# Patient Record
Sex: Female | Born: 1965 | Race: White | Hispanic: No | Marital: Single | Smoking: Never smoker
Health system: Southern US, Community
[De-identification: ages and names within clinical notes are randomized; demographics above are authoritative.]

---

## 2010-08-14 ENCOUNTER — Emergency Department (HOSPITAL_COMMUNITY): Admission: EM | Admit: 2010-08-14 | Discharge: 2010-08-14 | Payer: Self-pay | Admitting: Emergency Medicine

## 2021-03-16 ENCOUNTER — Emergency Department (HOSPITAL_BASED_OUTPATIENT_CLINIC_OR_DEPARTMENT_OTHER)
Admission: EM | Admit: 2021-03-16 | Discharge: 2021-03-16 | Disposition: A | Discharge status: Home / Self Care | Payer: Self-pay | Attending: Emergency Medicine | Admitting: Emergency Medicine

## 2021-03-16 ENCOUNTER — Encounter (HOSPITAL_BASED_OUTPATIENT_CLINIC_OR_DEPARTMENT_OTHER): Payer: Self-pay

## 2021-03-16 ENCOUNTER — Other Ambulatory Visit: Payer: Self-pay

## 2021-03-16 ENCOUNTER — Emergency Department (HOSPITAL_BASED_OUTPATIENT_CLINIC_OR_DEPARTMENT_OTHER): Payer: Self-pay

## 2021-03-16 DIAGNOSIS — R300 Dysuria: Secondary | ICD-10-CM | POA: Insufficient documentation

## 2021-03-16 DIAGNOSIS — Z87891 Personal history of nicotine dependence: Secondary | ICD-10-CM | POA: Insufficient documentation

## 2021-03-16 DIAGNOSIS — Z20822 Contact with and (suspected) exposure to covid-19: Secondary | ICD-10-CM | POA: Insufficient documentation

## 2021-03-16 DIAGNOSIS — R109 Unspecified abdominal pain: Secondary | ICD-10-CM | POA: Insufficient documentation

## 2021-03-16 LAB — CBC WITH DIFFERENTIAL/PLATELET
Abs Immature Granulocytes: 0.02 10*3/uL (ref 0.00–0.07)
Basophils Absolute: 0 10*3/uL (ref 0.0–0.1)
Basophils Relative: 0 %
Eosinophils Absolute: 0.1 10*3/uL (ref 0.0–0.5)
Eosinophils Relative: 1 %
HCT: 38.8 % (ref 36.0–46.0)
Hemoglobin: 12.7 g/dL (ref 12.0–15.0)
Immature Granulocytes: 0 %
Lymphocytes Relative: 19 %
Lymphs Abs: 1.4 10*3/uL (ref 0.7–4.0)
MCH: 29.9 pg (ref 26.0–34.0)
MCHC: 32.7 g/dL (ref 30.0–36.0)
MCV: 91.3 fL (ref 80.0–100.0)
Monocytes Absolute: 0.8 10*3/uL (ref 0.1–1.0)
Monocytes Relative: 10 %
Neutro Abs: 5.4 10*3/uL (ref 1.7–7.7)
Neutrophils Relative %: 70 %
Platelets: 262 10*3/uL (ref 150–400)
RBC: 4.25 MIL/uL (ref 3.87–5.11)
RDW: 13.8 % (ref 11.5–15.5)
WBC: 7.7 10*3/uL (ref 4.0–10.5)
nRBC: 0 % (ref 0.0–0.2)

## 2021-03-16 LAB — URINALYSIS, ROUTINE W REFLEX MICROSCOPIC
Bilirubin Urine: NEGATIVE
Glucose, UA: NEGATIVE mg/dL
Ketones, ur: 15 mg/dL — AB
Nitrite: NEGATIVE
Protein, ur: NEGATIVE mg/dL
Specific Gravity, Urine: 1.01 (ref 1.005–1.030)
pH: 5.5 (ref 5.0–8.0)

## 2021-03-16 LAB — COMPREHENSIVE METABOLIC PANEL
ALT: 30 U/L (ref 0–44)
AST: 20 U/L (ref 15–41)
Albumin: 3.7 g/dL (ref 3.5–5.0)
Alkaline Phosphatase: 66 U/L (ref 38–126)
Anion gap: 8 (ref 5–15)
BUN: 19 mg/dL (ref 6–20)
CO2: 29 mmol/L (ref 22–32)
Calcium: 9.2 mg/dL (ref 8.9–10.3)
Chloride: 102 mmol/L (ref 98–111)
Creatinine, Ser: 0.8 mg/dL (ref 0.44–1.00)
GFR, Estimated: >60 mL/min (ref >60)
Glucose, Bld: 105 mg/dL — ABNORMAL HIGH (ref 70–99)
Potassium: 3.8 mmol/L (ref 3.5–5.1)
Sodium: 139 mmol/L (ref 135–145)
Total Bilirubin: 0.3 mg/dL (ref 0.3–1.2)
Total Protein: 7.1 g/dL (ref 6.5–8.1)

## 2021-03-16 LAB — URINALYSIS, MICROSCOPIC (REFLEX)

## 2021-03-16 LAB — RESP PANEL BY RT-PCR (FLU A&B, COVID) ARPGX2
Influenza A by PCR: NEGATIVE
Influenza B by PCR: NEGATIVE
SARS Coronavirus 2 by RT PCR: NEGATIVE

## 2021-03-16 LAB — LIPASE, BLOOD: Lipase: 27 U/L (ref 11–51)

## 2021-03-16 MED ORDER — CEPHALEXIN 500 MG PO CAPS: 1000.0000 mg | ORAL_CAPSULE | Freq: Two times a day (BID) | ORAL | 0 refills | Status: AC

## 2021-03-16 MED ORDER — KETOROLAC TROMETHAMINE 60 MG/2ML IM SOLN
60.0000 mg | Freq: Once | INTRAMUSCULAR | Status: AC
  Administered 2021-03-16: 60 mg via INTRAMUSCULAR
  Filled 2021-03-16: qty 2

## 2021-03-16 NOTE — ED Triage Notes (Signed)
R flank pain x 1 week that is increasingly worse today.  Reports some dysuria but has not noticed any hematuria.

## 2021-03-16 NOTE — Discharge Instructions (Addendum)
You were evaluated in the Emergency Department and after careful evaluation, we did not find any emergent condition requiring admission or further testing in the hospital.  Please take the antibiotic as directed until finished.  You may follow-up with [facility] which is a free clinic here in the area, I provided the contact information.  Please return to the ER for any new or worsening symptoms.  Please return to the Emergency Department if you experience any worsening of your condition.  We encourage you to follow up with a primary care provider.  Thank you for allowing [place] to be a part of your care.

## 2021-03-16 NOTE — ED Provider Notes (Signed)
[facility] EMERGENCY DEPARTMENT Provider Note   CSN: [id number] Arrival date & time: 03/16/21  1516     History Chief Complaint  Patient presents with  . Flank Pain    Alyssa Wilkinson is a 55 y.o. female.  HPI 55 year old female with no significant medical history presents to the ER with complaints of 1 week of right flank pain.  Patient states that she went on vacation last week and was drinking a lot of soda.  She reports gradual onset of right-sided flank pain.  She attributed this to soda use, however stopped drinking soda all of this week but the pain has continued to get worse.  Pain is sharp and stabbing.  Denies any nausea, vomiting.  She endorses some dysuria but no hematuria.  No prior history of kidney stones, however the patient states that she had a similar presentation where her scan was inconclusive, possibly showed a recently passed stone.  She denies any abdominal pain, diarrhea, pelvic pain or vaginal bleeding.    History reviewed. No pertinent past medical history.  There are no problems to display for this patient.   History reviewed. No pertinent surgical history.   OB History   No obstetric history on file.     History reviewed. No pertinent family history.  Social History   Tobacco Use  . Smoking status: Never Smoker  . Smokeless tobacco: Never Used  Vaping Use  . Vaping Use: Never used  Substance Use Topics  . Alcohol use: Never  . Drug use: Never    Home Medications Prior to Admission medications   Medication Sig Start Date End Date Taking? Authorizing Provider  cephALEXin (KEFLEX) 500 MG capsule Take 2 capsules (1,000 mg total) by mouth 2 (two) times daily for 7 days. 03/16/21 03/23/21 Yes Mare Ferrari, PA-C  Multiple Vitamin (MULTIVITAMIN) tablet Take 1 tablet by mouth daily.   Yes [provider]    Allergies    Patient has no allergy information on record.  Review of Systems   Review of Systems  Constitutional:  Negative for chills and fever.  HENT: Negative for ear pain and sore throat.   Eyes: Negative for pain and visual disturbance.  Respiratory: Negative for cough and shortness of breath.   Cardiovascular: Negative for chest pain and palpitations.  Gastrointestinal: Negative for abdominal pain and vomiting.  Genitourinary: Positive for dysuria and flank pain. Negative for hematuria, vaginal bleeding, vaginal discharge and vaginal pain.  Musculoskeletal: Negative for arthralgias and back pain.  Skin: Negative for color change and rash.  Neurological: Negative for seizures and syncope.  All other systems reviewed and are negative.   Physical Exam Updated Vital Signs BP (!) 132/91 (BP Location: Right Arm)   Pulse 88   Temp 98.4 F (36.9 C) (Oral)   Resp 18   Ht 5\' 2"  (1.575 m)   Wt 74.8 kg   SpO2 99%   BMI 30.18 kg/m   Physical Exam Vitals and nursing note reviewed.  Constitutional:      General: She is not in acute distress.    Appearance: She is well-developed. She is not ill-appearing or diaphoretic.  HENT:     Head: Normocephalic and atraumatic.  Eyes:     Conjunctiva/sclera: Conjunctivae normal.  Cardiovascular:     Rate and Rhythm: Normal rate and regular rhythm.     Heart sounds: No murmur heard.   Pulmonary:     Effort: Pulmonary effort is normal. No respiratory distress.  Breath sounds: Normal breath sounds.  Abdominal:     Palpations: Abdomen is soft.     Tenderness: There is no abdominal tenderness. There is right CVA tenderness.  Musculoskeletal:        General: Normal range of motion.     Cervical back: Normal range of motion and neck supple.  Skin:    General: Skin is warm and dry.  Neurological:     General: No focal deficit present.     Mental Status: She is alert and oriented to person, place, and time.  Psychiatric:        Mood and Affect: Mood normal.        Behavior: Behavior normal.     ED Results / Procedures / Treatments   Labs (all  labs ordered are listed, but only abnormal results are displayed) Labs Reviewed  URINALYSIS, ROUTINE W REFLEX MICROSCOPIC - Abnormal; Notable for the following components:      Result Value   Hgb urine dipstick TRACE (*)    Ketones, ur 15 (*)    Leukocytes,Ua SMALL (*)    All other components within normal limits  URINALYSIS, MICROSCOPIC (REFLEX) - Abnormal; Notable for the following components:   Bacteria, UA RARE (*)    All other components within normal limits  COMPREHENSIVE METABOLIC PANEL - Abnormal; Notable for the following components:   Glucose, Bld 105 (*)    All other components within normal limits  URINE CULTURE  RESP PANEL BY RT-PCR (FLU A&B, COVID) [id number]  CBC WITH DIFFERENTIAL/PLATELET  LIPASE, BLOOD    EKG None  Radiology CT Renal Stone Study  Result Date: 03/16/2021 CLINICAL DATA:  Flank pain EXAM: CT ABDOMEN AND PELVIS WITHOUT CONTRAST TECHNIQUE: Multidetector CT imaging of the abdomen and pelvis was performed following the standard protocol without IV contrast. COMPARISON:  None. FINDINGS: Lower chest: No acute abnormality. Hepatobiliary: No focal liver abnormality is seen. Status post cholecystectomy. No biliary dilatation. Pancreas: Unremarkable. No pancreatic ductal dilatation or surrounding inflammatory changes. Spleen: Normal in size without focal abnormality. Adrenals/Urinary Tract: Adrenal glands are unremarkable. Mild RIGHT-sided hydronephrosis and proximal hydroureter, and mild perinephric/periureteral inflammation. No obstructing ureteral stone. Bladder is unremarkable, decompressed. No bladder stone is seen. Stomach/Bowel: No dilated large or small bowel loops. No evidence of bowel wall inflammation. Stomach is unremarkable. Appendix is normal. Vascular/Lymphatic: Mild aortic atherosclerosis. No enlarged lymph nodes are seen within the abdomen or pelvis. Reproductive: Presumed hysterectomy. No adnexal mass or free fluid is seen. Other: No free fluid or abscess  collection. No free intraperitoneal air. Musculoskeletal: No acute appearing osseous abnormality. 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 IMPRESSION: 1. Mild RIGHT-sided hydronephrosis and proximal hydroureter, and mild perinephric/periureteral inflammation. No obstructing ureteral stone. Findings could represent a recently passed stone or an ascending ureteral infection. Recommend correlation with urinalysis. 2. No other acute or significant findings within the abdomen or pelvis. No bowel obstruction or evidence of bowel wall inflammation. Appendix is normal. Aortic Atherosclerosis (ICD10-I70.0). Electronically Signed   By: Bary Richard M.D.   On: 03/16/2021 17:43    Procedures Procedures   Medications Ordered in ED Medications  ketorolac (TORADOL) injection 60 mg (60 mg Intramuscular Given 03/16/21 1757)    ED Course  I have reviewed the triage vital signs and the nursing notes.  Pertinent labs & imaging results that were available during my care of the patient were reviewed by me and considered in my medical decision making (see chart for details).  MDM Rules/Calculators/A&P                          55 year old female presents to the ER with right flank pain.  On arrival, she is well-appearing, no acute distress, resting comfortably in the ER bed.  It is overall reassuring.  Afebrile, not tachycardic, tachypneic or hypoxic.  Sickle exam consistent with right CVA tenderness, soft and nontender abdomen.  Dx includes pyelonephritis, renal stones, musculoskeletal strain, COVID-19/viral infection  Labs and imaging ordered, reviewed and interpreted by me.  CBC without leukocytosis, CMP unremarkable.  Normal lipase.  UA with trace hemoglobin, 15 of ketones, small leukocytes and rare bacteria.  Not overly consistent with a UTI, however sent for culture.  CT renal study as below IMPRESSION:  1. Mild RIGHT-sided hydronephrosis and proximal hydroureter, and  mild  perinephric/periureteral inflammation. No obstructing ureteral  stone. Findings could represent a recently passed stone or an  ascending ureteral infection. Recommend correlation with urinalysis.  2. No other acute or significant findings within the abdomen or  pelvis. No bowel obstruction or evidence of bowel wall inflammation.  Appendix is normal.      Given patient is having some dysuria, will treat for possible pyelonephritis.  Patient's pain under control currently.  Patient is uninsured at this time, will provide her referral to [facility], strict return precautions discussed.  She was understanding and is agreeable.  Stable for discharge. Final Clinical Impression(s) / ED Diagnoses Final diagnoses:  Right flank pain    Rx / DC Orders ED Discharge Orders         Ordered    cephALEXin (KEFLEX) 500 MG capsule  2 times daily        03/16/21 1833           Leone Brand 03/16/21 Lyndal Pulley, MD 03/17/21 [phone]

## 2021-03-19 LAB — URINE CULTURE: Culture: 80000 — AB

## 2021-03-20 ENCOUNTER — Telehealth: Payer: Self-pay | Admitting: *Deleted

## 2022-02-27 IMAGING — CT CT RENAL STONE PROTOCOL
2 of 4 series · 16 of 46 positions shown, 18 images · non-contrast
Comparison: None.

CLINICAL DATA: Flank pain

EXAM:
CT ABDOMEN AND PELVIS WITHOUT CONTRAST
TECHNIQUE: Multidetector CT imaging of the abdomen and pelvis was performed
following the standard protocol without IV contrast.

[Series 2: axial st · axial · 0.77mm/px · z∈[+674,+1094]mm · 13 of 92 slices shown, 15 images]
[im 4/92  soft-tissue]
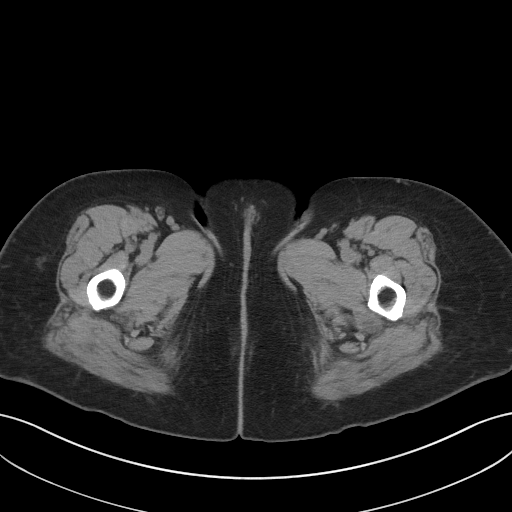
[im 4/92  bone]
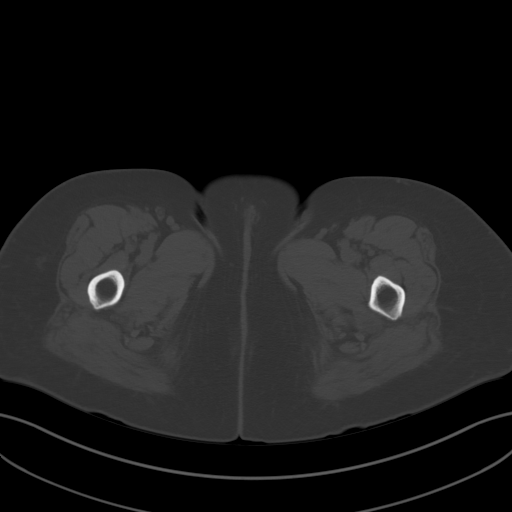
[im 12/92  soft-tissue]
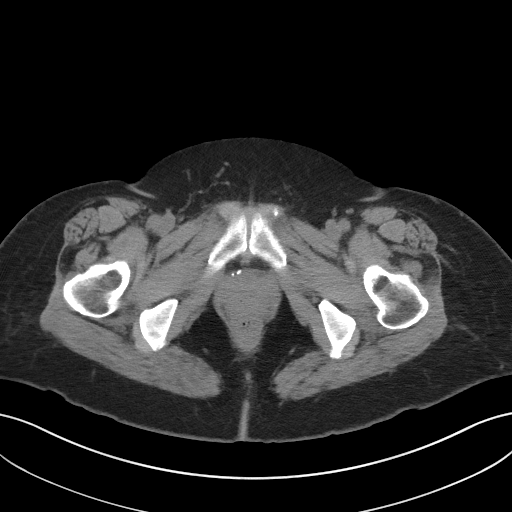
[im 19/92  soft-tissue]
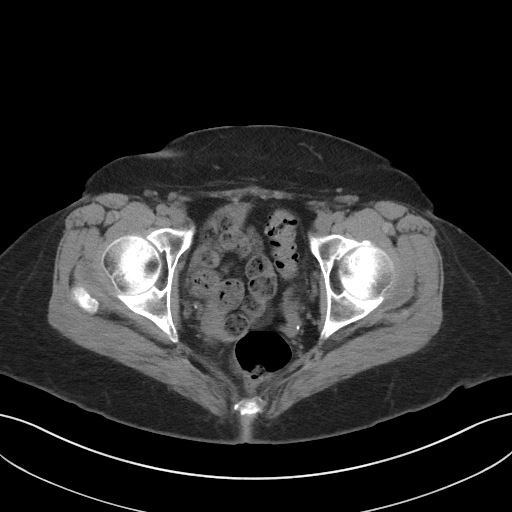
[im 27/92  soft-tissue]
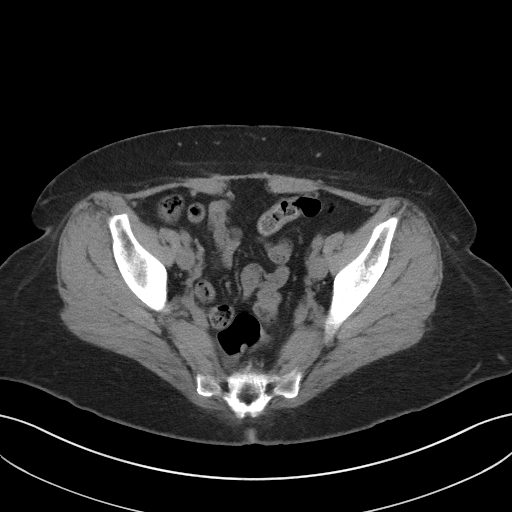
[im 31/92  soft-tissue]
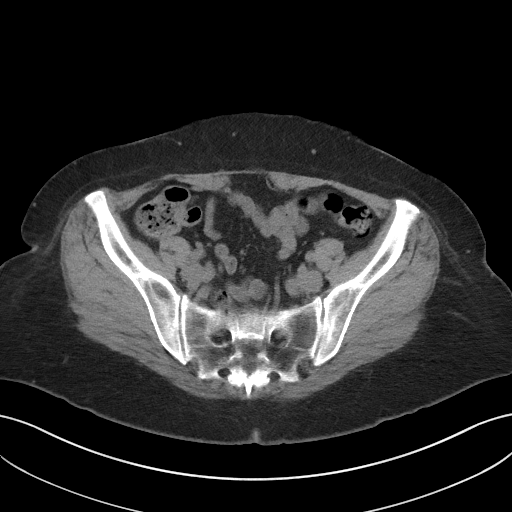
[im 38/92  soft-tissue]
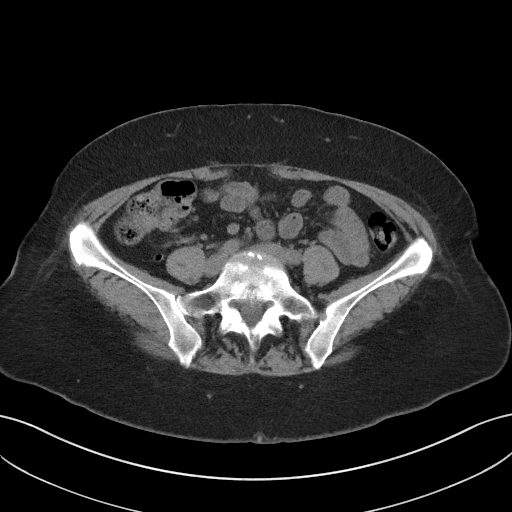
[im 46/92  soft-tissue]
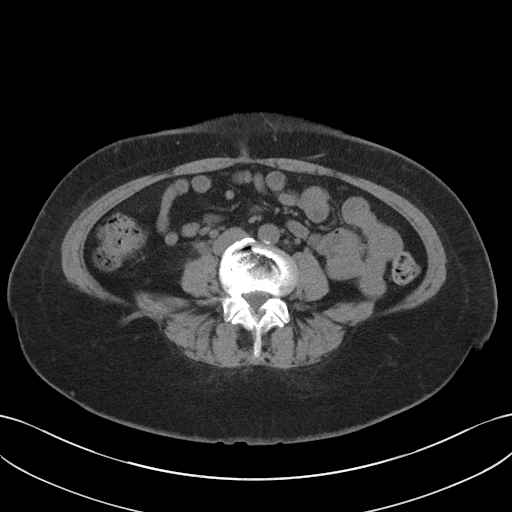
[im 54/92  soft-tissue]
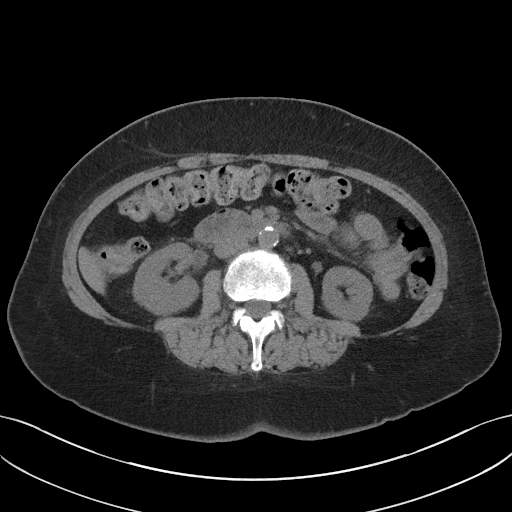
[im 61/92  soft-tissue]
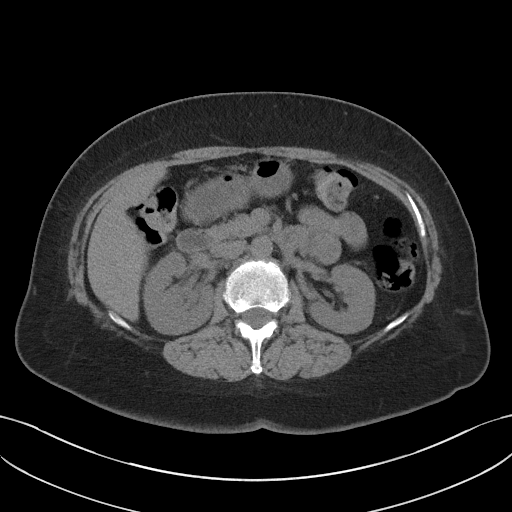
[im 61/92  bone]
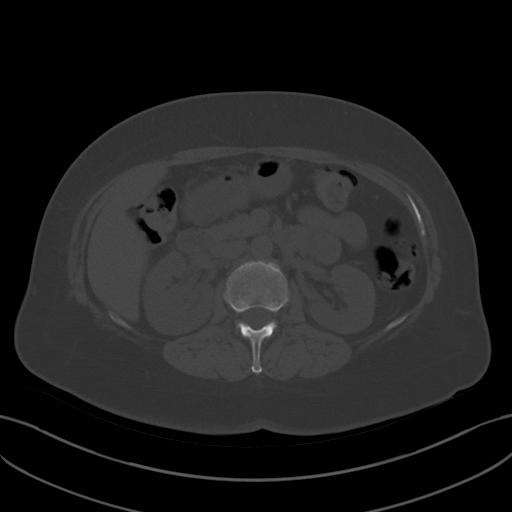
[im 65/92  soft-tissue]
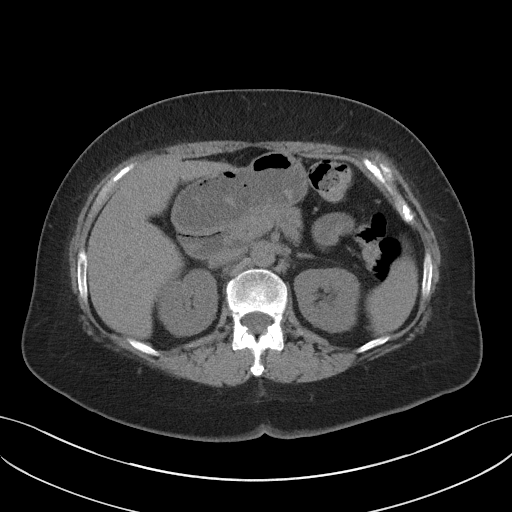
[im 73/92  soft-tissue]
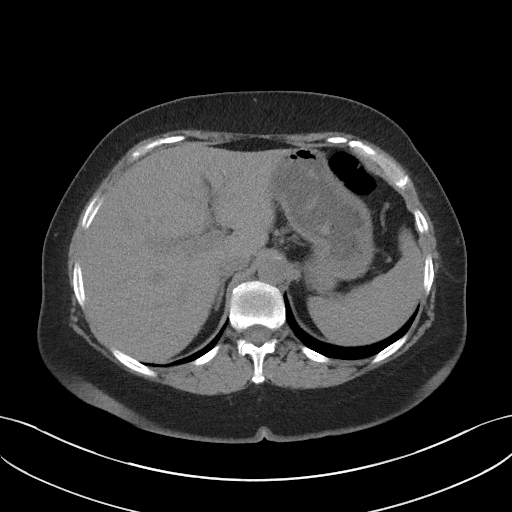
[im 80/92  soft-tissue]
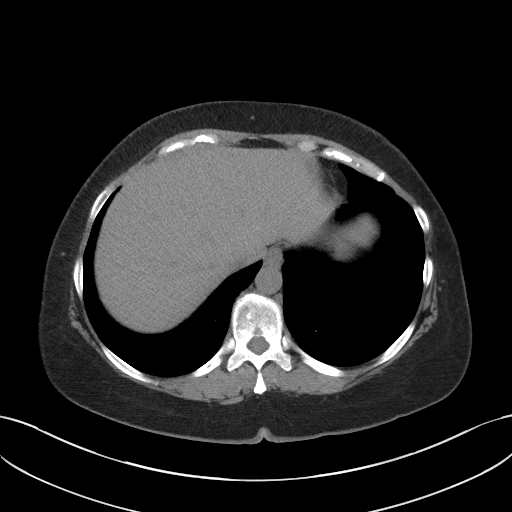
[im 88/92  soft-tissue]
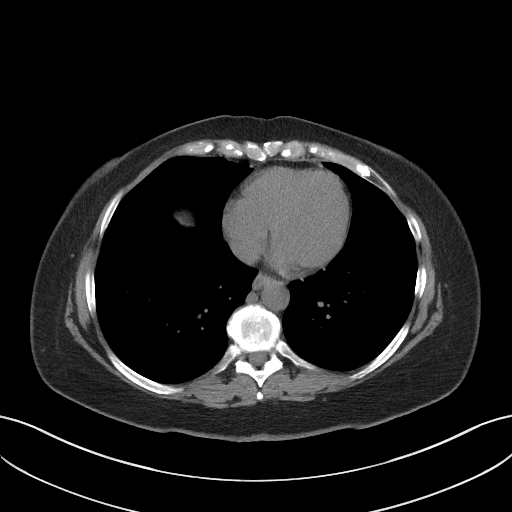

[Series 5: coronal st · coronal · 0.90mm/px · 3 of 104 slices shown]
[im 35/104  soft-tissue]
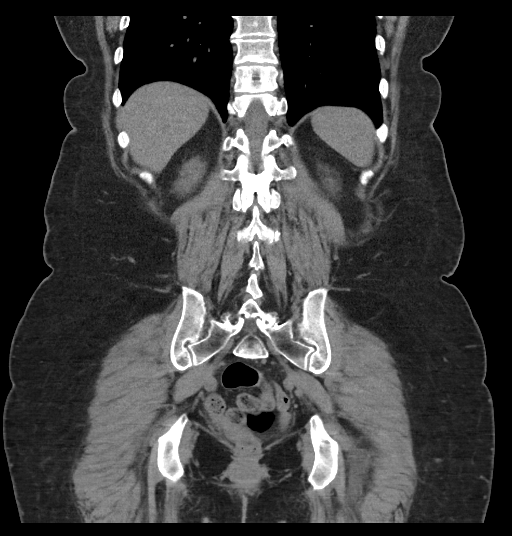
[im 46/104  soft-tissue]
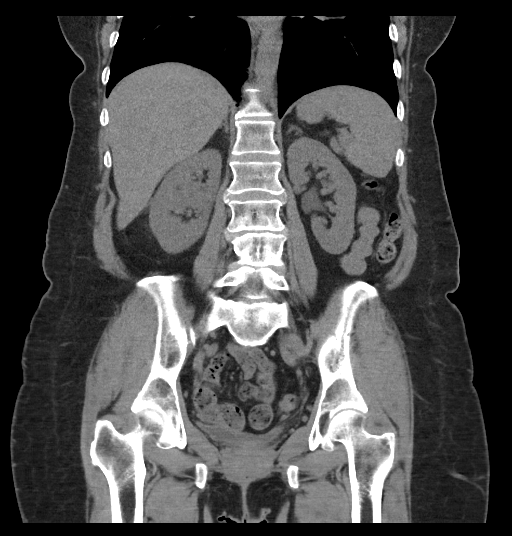
[im 58/104  soft-tissue]
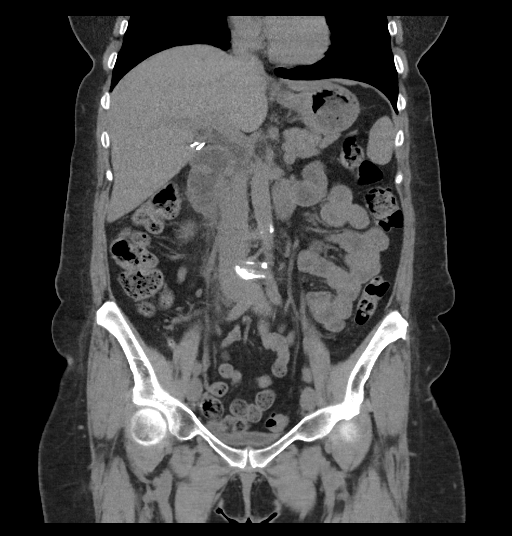

[16 of 46 positions shown; findings below may reference images not displayed]

FINDINGS: Lower chest: No acute abnormality.

Hepatobiliary: No focal liver abnormality is seen. Status post
cholecystectomy. No biliary dilatation.

Pancreas: Unremarkable. No pancreatic ductal dilatation or
surrounding inflammatory changes.

Spleen: Normal in size without focal abnormality.

Adrenals/Urinary Tract: Adrenal glands are unremarkable. Mild
RIGHT-sided hydronephrosis and proximal hydroureter, and mild
perinephric/periureteral inflammation. No obstructing ureteral
stone. Bladder is unremarkable, decompressed. No bladder stone is
seen.

Stomach/Bowel: No dilated large or small bowel loops. No evidence of
bowel wall inflammation. Stomach is unremarkable. Appendix is
normal.

Vascular/Lymphatic: Mild aortic atherosclerosis. No enlarged lymph
nodes are seen within the abdomen or pelvis.

Reproductive: Presumed hysterectomy. No adnexal mass or free fluid
is seen.

Other: No free fluid or abscess collection. No free intraperitoneal
air.

Musculoskeletal: No acute appearing osseous abnormality.
Degenerative spondylosis of the lower lumbar spine, moderate in
degree.
IMPRESSION: 1. Mild RIGHT-sided hydronephrosis and proximal hydroureter, and
mild perinephric/periureteral inflammation. No obstructing ureteral
stone. Findings could represent a recently passed stone or an
ascending ureteral infection. Recommend correlation with urinalysis.
2. No other acute or significant findings within the abdomen or
pelvis. No bowel obstruction or evidence of bowel wall inflammation.
Appendix is normal.

Aortic Atherosclerosis ([id number]).
